# Patient Record
Sex: Male | Born: 1975 | Race: Black or African American | Hispanic: No | Marital: Single
Health system: Southern US, Community
[De-identification: ages and names within clinical notes are randomized; demographics above are authoritative.]

---

## 2005-03-04 ENCOUNTER — Ambulatory Visit: Payer: Self-pay | Admitting: Orthopedic Surgery

## 2005-04-09 ENCOUNTER — Ambulatory Visit (HOSPITAL_COMMUNITY): Admission: RE | Admit: 2005-04-09 | Discharge: 2005-04-09 | Payer: Self-pay

## 2008-03-04 ENCOUNTER — Emergency Department (HOSPITAL_COMMUNITY): Admission: EM | Admit: 2008-03-04 | Discharge: 2008-03-04 | Payer: Self-pay | Admitting: Emergency Medicine

## 2021-05-29 DIAGNOSIS — J392 Other diseases of pharynx: Secondary | ICD-10-CM | POA: Insufficient documentation

## 2021-06-03 DIAGNOSIS — M436 Torticollis: Secondary | ICD-10-CM | POA: Insufficient documentation

## 2021-06-03 DIAGNOSIS — W57XXXA Bitten or stung by nonvenomous insect and other nonvenomous arthropods, initial encounter: Secondary | ICD-10-CM | POA: Insufficient documentation

## 2021-06-04 ENCOUNTER — Other Ambulatory Visit: Payer: Self-pay

## 2021-06-04 ENCOUNTER — Ambulatory Visit (INDEPENDENT_AMBULATORY_CARE_PROVIDER_SITE_OTHER): Payer: BC Managed Care – PPO | Admitting: Internal Medicine

## 2021-06-04 DIAGNOSIS — M436 Torticollis: Secondary | ICD-10-CM | POA: Diagnosis not present

## 2021-06-04 DIAGNOSIS — R198 Other specified symptoms and signs involving the digestive system and abdomen: Secondary | ICD-10-CM

## 2021-06-04 DIAGNOSIS — W57XXXA Bitten or stung by nonvenomous insect and other nonvenomous arthropods, initial encounter: Secondary | ICD-10-CM | POA: Diagnosis not present

## 2021-06-04 DIAGNOSIS — R0989 Other specified symptoms and signs involving the circulatory and respiratory systems: Secondary | ICD-10-CM

## 2021-06-04 NOTE — Progress Notes (Addendum)
[facility]  Reason for Consult: Globus sensation following tick bite Referring Provider: Dr. Salli Real  Assessment: I think it is very unlikely that he has Lyme disease or other tickborne illness.  This is based on the fact that globus sensation is not a known side effect of Lyme disease and also the fact that the incidence of Lyme disease in [place] is very low.  He wanted to know if he could be treated empirically for Lyme.  I told him that it would be best to obtain serology then make any decision about further antibiotic therapy.  Plan: Lyme serology with reflex to Western blot I will call him with results next week  Addendum: I called and let Joseph Colon know that his total Lyme antibody was negative and that his recent illness is not due to Lyme disease or any other active infection.  Patient Active Problem List   Diagnosis Date Noted   Tick bite 06/03/2021    Priority: Medium   Globus sensation 06/04/2021   Neck stiffness 06/03/2021    Patient's Medications  New Prescriptions   No medications on file  Previous Medications   CHOLECALCIFEROL 25 MCG (1000 UT) CAPSULE    Take by mouth.  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  In May he noticed an attached tick on his left anterior thigh.  It was not engorged with blood.  He thinks it may have been there for 1 to 2 days.  He removed the tick.  The area of the bite was slightly raised and red for several days.  He treated it with topical alcohol.  Shortly afterward he began to notice an unusual sensation in his lower throat like something was "stuck there".  He did not have any sore throat.  He has not had any fever.  He has not had any generalized rash or target lesions.  Several weeks ago he did develop some pruritus on the medial aspect of his  right ankle.  He noted that when he scratched it it did get bright red.  That has since resolved.  He had a right shoulder surgery last December and is undergoing physical therapy.  He still has some soreness there but no other new joint pain or swelling.  He says that he did some reading and has been concerned that he might have Lyme disease.    He was seen at [facility] on 2 occasions.  The first time he recalls being given some type of injection and he was prescribed a trimethoprim/sulfamethoxazole.  He initially thought it was a little better but he continued to be bothered by the sensation in his throat.  He went back on 05/20/2021 and was given a Z-Pak and a prednisone Dosepak.  He had no change in his symptoms.  He was referred to ENT and saw someone (does not recall who) 2 days ago.  He was told that they could find no reason for his sensation.  Review of Systems: Review of Systems  Constitutional:  Negative for chills, diaphoresis, fever, malaise/fatigue and weight loss.  HENT:         As noted in HPI.  Respiratory:  Negative for cough, sputum production and shortness of breath.   Cardiovascular:  Negative for chest pain.  Gastrointestinal:  Negative for abdominal pain, diarrhea, heartburn, nausea and vomiting.  Musculoskeletal:  Positive for joint pain.  Skin:  Positive for itching. Negative for rash.     No past medical history on file.     No family history on file. Not on File  OBJECTIVE: Vitals:   06/04/21 1015  BP: 118/77  Pulse: 65  Temp: 98.2 F (36.8 C)  TempSrc: Oral  SpO2: 98%  Weight: 197 lb (89.4 kg)   There is no height or weight on file to calculate BMI.   Physical Exam Constitutional:      Comments: He is pleasant and in no distress.  HENT:     Mouth/Throat:     Mouth: Mucous membranes are moist.     Pharynx: Oropharynx is clear. No oropharyngeal exudate.  Cardiovascular:     Rate and Rhythm: Normal rate and regular rhythm.     Heart sounds:  No murmur heard. Pulmonary:     Effort: Pulmonary effort is normal.     Breath sounds: Normal breath sounds.  Abdominal:     Palpations: Abdomen is soft.     Tenderness: There is no abdominal tenderness.  Musculoskeletal:        General: No swelling or tenderness.     Comments: His right shoulder incisions have healed nicely without evidence of infection.  Lymphadenopathy:     Head:     Right side of head: No submental or submandibular adenopathy.     Left side of head: No submental or submandibular adenopathy.     Cervical: No cervical adenopathy.  Skin:    Findings: No rash.  Neurological:     General: No focal deficit present.  Psychiatric:        Mood and Affect: Mood normal.    Microbiology: No results found for this or any previous visit (from the past 240 hour(s)).  Cliffton Asters, MD [facility] [phone] pager   [phone] cell 06/04/2021, 10:48 AM

## 2021-06-05 LAB — B. BURGDORFI ANTIBODIES: B burgdorferi Ab IgG+IgM: <0.9 index

## 2021-07-09 ENCOUNTER — Other Ambulatory Visit: Payer: Self-pay | Admitting: Nurse Practitioner

## 2021-07-09 DIAGNOSIS — R221 Localized swelling, mass and lump, neck: Secondary | ICD-10-CM

## 2021-07-27 ENCOUNTER — Other Ambulatory Visit: Payer: Self-pay | Admitting: Nurse Practitioner

## 2021-07-27 ENCOUNTER — Ambulatory Visit
Admission: RE | Admit: 2021-07-27 | Discharge: 2021-07-27 | Disposition: A | Discharge status: Home / Self Care | Payer: BC Managed Care – PPO | Source: Ambulatory Visit | Attending: Nurse Practitioner | Admitting: Nurse Practitioner

## 2021-07-27 DIAGNOSIS — R221 Localized swelling, mass and lump, neck: Secondary | ICD-10-CM

## 2021-07-27 MED ORDER — IOPAMIDOL (ISOVUE-300) INJECTION 61%
75.0000 mL | Freq: Once | INTRAVENOUS | Status: AC | PRN
  Administered 2021-07-27: 75 mL via INTRAVENOUS

## 2021-10-22 ENCOUNTER — Encounter: Payer: Self-pay | Admitting: Podiatry

## 2021-10-22 ENCOUNTER — Ambulatory Visit (INDEPENDENT_AMBULATORY_CARE_PROVIDER_SITE_OTHER): Payer: BC Managed Care – PPO

## 2021-10-22 ENCOUNTER — Ambulatory Visit: Payer: BC Managed Care – PPO | Admitting: Podiatry

## 2021-10-22 ENCOUNTER — Other Ambulatory Visit: Payer: Self-pay

## 2021-10-22 DIAGNOSIS — M7752 Other enthesopathy of left foot: Secondary | ICD-10-CM

## 2021-10-22 DIAGNOSIS — M7750 Other enthesopathy of unspecified foot: Secondary | ICD-10-CM

## 2021-10-22 MED ORDER — MELOXICAM 15 MG PO TABS: 15.0000 mg | ORAL_TABLET | Freq: Every day | ORAL | 3 refills | Status: AC

## 2021-10-22 NOTE — Patient Instructions (Signed)

## 2021-10-26 ENCOUNTER — Encounter: Payer: Self-pay | Admitting: Podiatry

## 2021-10-26 NOTE — Progress Notes (Signed)
°  Subjective:  Patient ID: Joseph Colon, male    DOB: 13-Jun-1976,  MRN: [id number]  Chief Complaint  Patient presents with   Foot Pain    NP  L  foot pain  not diabetic    46 y.o. male presents with the above complaint. History confirmed with patient.  Pain is mostly in the arch.  Feels very tight in the morning.  He tried stretching  Objective:  Physical Exam: warm, good capillary refill, no trophic changes or ulcerative lesions, normal DP and PT pulses, and normal sensory exam. Left Foot: point tenderness of the mid plantar fascia  Radiographs: Multiple views x-ray of the left foot: no fracture, dislocation, swelling or degenerative changes noted no major plantar calcaneal spur Assessment:   1. Tendonitis of ankle or foot      Plan:  Patient was evaluated and treated and all questions answered.  Discussed the etiology and treatment options for plantar fasciitis including stretching, formal physical therapy, supportive shoegears such as a running shoe or sneaker, pre fabricated orthoses, injection therapy, and oral medications. We also discussed the role of surgical treatment of this for patients who do not improve after exhausting non-surgical treatment options.   -XR reviewed with patient -Educated patient on stretching and icing of the affected limb -Injection delivered to the plantar fascia of the left foot. -Rx for meloxicam. Educated on use, risks and benefits of the medication -Recommended plantar fascial brace we do not have his size in stock currently and I showed him the type and size to get on [place] he will get this.  After sterile prep with povidone-iodine solution and alcohol, the left heel was injected with 0.5cc 2% xylocaine plain, 0.5cc 0.5% marcaine plain, 5mg  triamcinolone acetonide, and 2mg  dexamethasone was injected along the medial plantar fascia at the insertion on the plantar calcaneus. The patient tolerated the procedure well without  complication.   Return in about 1 month (around 11/22/2021) for recheck plantar fasciitis.

## 2021-12-01 ENCOUNTER — Ambulatory Visit: Payer: BC Managed Care – PPO | Admitting: Podiatry

## 2021-12-01 ENCOUNTER — Other Ambulatory Visit: Payer: Self-pay

## 2021-12-01 DIAGNOSIS — M21862 Other specified acquired deformities of left lower leg: Secondary | ICD-10-CM

## 2021-12-01 DIAGNOSIS — M722 Plantar fascial fibromatosis: Secondary | ICD-10-CM | POA: Diagnosis not present

## 2021-12-03 ENCOUNTER — Encounter: Payer: Self-pay | Admitting: Podiatry

## 2021-12-03 NOTE — Progress Notes (Signed)
°  Subjective:  Patient ID: Joseph Colon, male    DOB: 1975-11-04,  MRN: [id number]  Chief Complaint  Patient presents with   Plantar Fasciitis    Follow up left foot    46 y.o. male presents with the above complaint. History confirmed with patient.  He notes a moderate amount of improvement.  The injection was helpful.  The plantar fascia brace has not helped as much.  Objective:  Physical Exam: warm, good capillary refill, no trophic changes or ulcerative lesions, normal DP and PT pulses, and normal sensory exam. Left Foot: Still some tenderness in the mid plantar fascia improved since last visit.  Radiographs: Multiple views x-ray of the left foot: no fracture, dislocation, swelling or degenerative changes noted no major plantar calcaneal spur Assessment:   1. Plantar fasciitis of left foot   2. Gastrocnemius equinus of left lower extremity      Plan:  Patient was evaluated and treated and all questions answered.  Discussed the etiology and treatment options for plantar fasciitis including stretching, formal physical therapy, supportive shoegears such as a running shoe or sneaker, pre fabricated orthoses, injection therapy, and oral medications. We also discussed the role of surgical treatment of this for patients who do not improve after exhausting non-surgical treatment options.   Overall state quite a bit of improvement the first injection.  I recommend he continue his home stretching and therapy exercise.  We also discussed the option of referral to a physical therapist for dry needling in the plantar fascia and calf which is very tight.  I have sent a referral to this for [facility] for him and I will see him back in a few months for reevaluation.   No follow-ups on file.

## 2022-03-01 ENCOUNTER — Ambulatory Visit: Payer: BC Managed Care – PPO | Admitting: Podiatry

## 2022-03-01 DIAGNOSIS — M722 Plantar fascial fibromatosis: Secondary | ICD-10-CM | POA: Diagnosis not present

## 2022-03-07 ENCOUNTER — Encounter: Payer: Self-pay | Admitting: Podiatry

## 2022-03-07 NOTE — Progress Notes (Signed)
?  Subjective:  ?Patient ID: Joseph Colon, male    DOB: 08/30/1976,  MRN: [id number] ? ?Chief Complaint  ?Patient presents with  ? Follow-up  ?  3 month for PF. Pain is minimal. Patient has not seen a physical therapist.   ? ? ?46 y.o. male presents with the above complaint. History confirmed with patient.  Doing much better nearly 100% at this point ? ?Objective:  ?Physical Exam: ?warm, good capillary refill, no trophic changes or ulcerative lesions, normal DP and PT pulses, and normal sensory exam. ?Left Foot: He has little to no tenderness in the plantar fascia ? ?Radiographs: ?Multiple views x-ray of the left foot: no fracture, dislocation, swelling or degenerative changes noted no major plantar calcaneal spur ?Assessment:  ? ?1. Plantar fasciitis of left foot   ? ? ? ?Plan:  ?Patient was evaluated and treated and all questions answered. ? ?Doing much better advised to continue therapy exercises.  I will see him back as needed if this returns ? ?Return if symptoms worsen or fail to improve.  ? ?

## 2022-06-09 ENCOUNTER — Other Ambulatory Visit: Payer: Self-pay | Admitting: Chiropractic Medicine

## 2022-06-09 ENCOUNTER — Ambulatory Visit
Admission: RE | Admit: 2022-06-09 | Discharge: 2022-06-09 | Disposition: A | Discharge status: Home / Self Care | Payer: BC Managed Care – PPO | Source: Ambulatory Visit | Attending: Chiropractic Medicine | Admitting: Chiropractic Medicine

## 2022-06-09 DIAGNOSIS — M545 Low back pain, unspecified: Secondary | ICD-10-CM

## 2023-05-06 IMAGING — CT CT NECK W/ CM
2 of 3 series · 7 of 14 positions shown, 8 images · IV contrast (iopamidol)
Comparison: None.

CLINICAL DATA: Right-sided neck swelling extending from under jaw
to shoulder. Difficulty swallowing

EXAM:
CT NECK WITH CONTRAST
TECHNIQUE: Multidetector CT imaging of the neck was performed using the
standard protocol following the bolus administration of intravenous
contrast.
CONTRAST:  75mL [id number]-WSS IOPAMIDOL (KH0G74-WSS) INJECTION 61%

[Series 3: neck · axial · 0.52mm/px · z∈[-298,-132]mm · 4 of 139 slices shown, 5 images]
[im 28/139  soft-tissue]
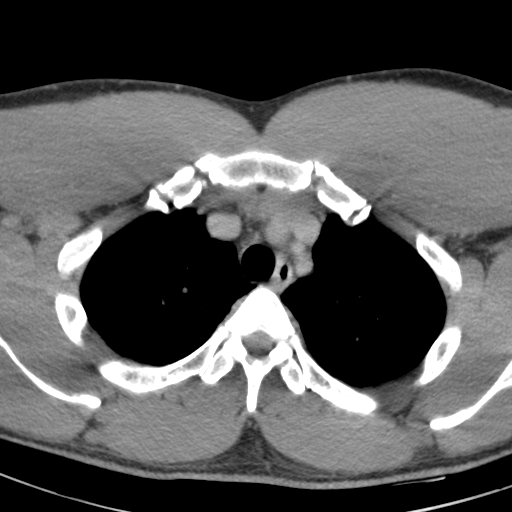
[im 28/139  bone]
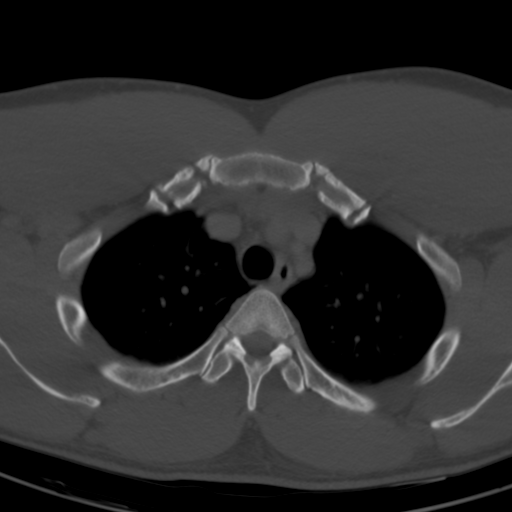
[im 56/139  bone]
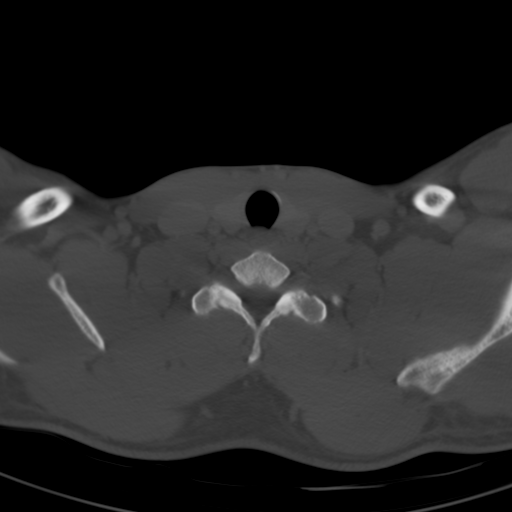
[im 83/139  bone]
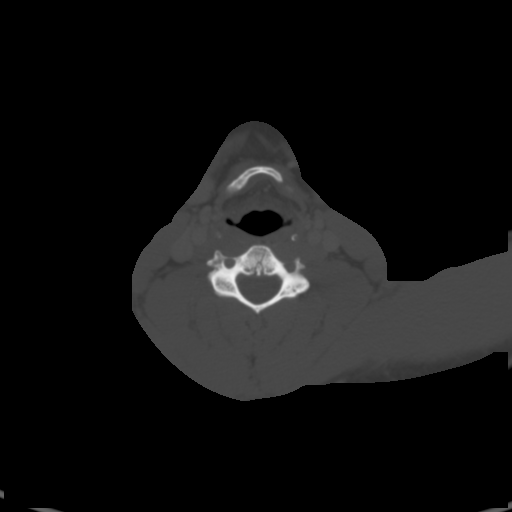
[im 111/139  bone]
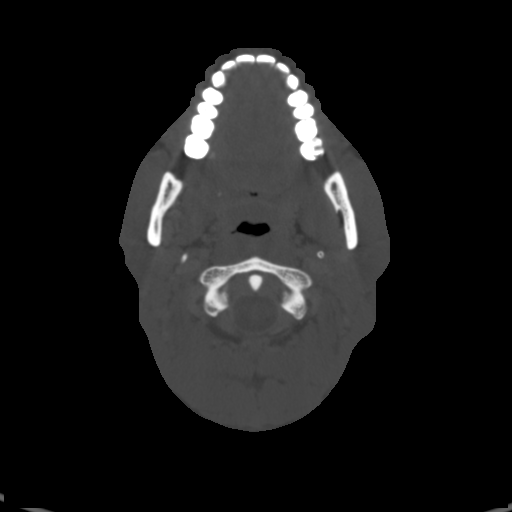

[Series 8: angled axial-oropharynx · axial · 0.47mm/px · z∈[-294,-158]mm · 3 of 138 slices shown]
[im 35/138  bone]
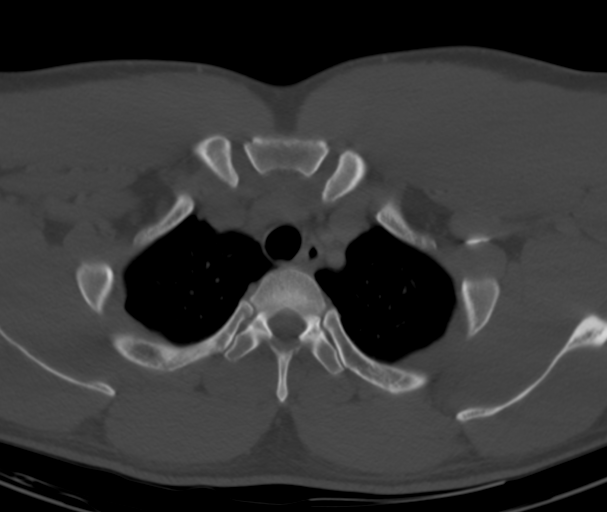
[im 69/138  bone]
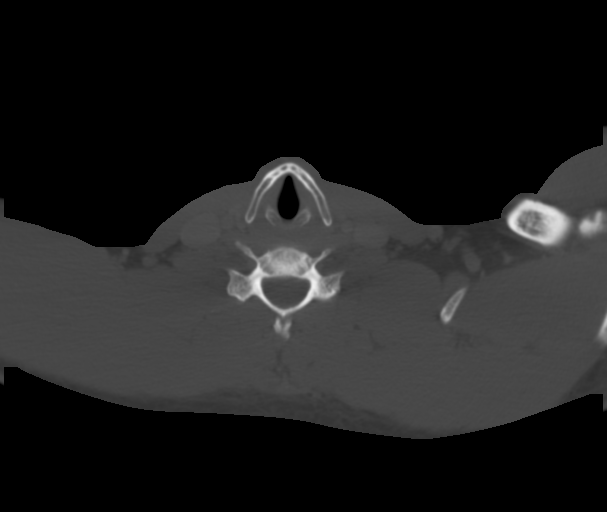
[im 103/138  bone]
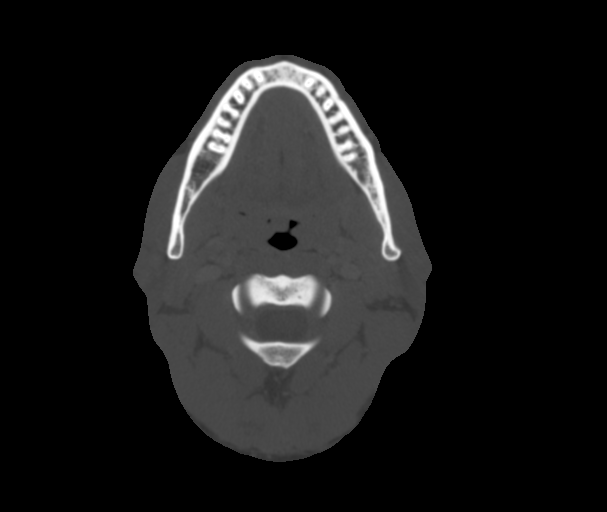

[7 of 14 positions shown; findings below may reference images not displayed]

FINDINGS: Pharynx and larynx: Normal. No mass or swelling.

Salivary glands: No inflammation, mass, or stone.

Thyroid: Normal.

Lymph nodes: None enlarged or abnormal density.

Vascular: Negative.

Limited intracranial: Negative.

Visualized orbits: Negative.

Mastoids and visualized paranasal sinuses: Clear.

Skeleton: No acute or aggressive process.

Upper chest: Negative.
IMPRESSION: Normal neck CT.  No explanation for symptoms.

## 2023-05-13 ENCOUNTER — Other Ambulatory Visit: Payer: Self-pay

## 2023-05-13 ENCOUNTER — Emergency Department (HOSPITAL_BASED_OUTPATIENT_CLINIC_OR_DEPARTMENT_OTHER): Payer: BC Managed Care – PPO

## 2023-05-13 ENCOUNTER — Emergency Department (HOSPITAL_BASED_OUTPATIENT_CLINIC_OR_DEPARTMENT_OTHER)
Admission: EM | Admit: 2023-05-13 | Discharge: 2023-05-13 | Disposition: A | Discharge status: Home / Self Care | Payer: BC Managed Care – PPO | Attending: Student | Admitting: Student

## 2023-05-13 DIAGNOSIS — R110 Nausea: Secondary | ICD-10-CM | POA: Diagnosis not present

## 2023-05-13 DIAGNOSIS — R140 Abdominal distension (gaseous): Secondary | ICD-10-CM | POA: Insufficient documentation

## 2023-05-13 DIAGNOSIS — R1084 Generalized abdominal pain: Secondary | ICD-10-CM | POA: Insufficient documentation

## 2023-05-13 LAB — CBC
HCT: 39.9 % (ref 39.0–52.0)
Hemoglobin: 13.4 g/dL (ref 13.0–17.0)
MCH: 29 pg (ref 26.0–34.0)
MCHC: 33.6 g/dL (ref 30.0–36.0)
MCV: 86.4 fL (ref 80.0–100.0)
Platelets: 187 10*3/uL (ref 150–400)
RBC: 4.62 MIL/uL (ref 4.22–5.81)
RDW: 12.8 % (ref 11.5–15.5)
WBC: 6.4 10*3/uL (ref 4.0–10.5)
nRBC: 0 % (ref 0.0–0.2)

## 2023-05-13 LAB — COMPREHENSIVE METABOLIC PANEL
ALT: 70 U/L — ABNORMAL HIGH (ref 0–44)
AST: 111 U/L — ABNORMAL HIGH (ref 15–41)
Albumin: 4.1 g/dL (ref 3.5–5.0)
Alkaline Phosphatase: 60 U/L (ref 38–126)
Anion gap: 9 (ref 5–15)
BUN: 23 mg/dL — ABNORMAL HIGH (ref 6–20)
CO2: 25 mmol/L (ref 22–32)
Calcium: 8.8 mg/dL — ABNORMAL LOW (ref 8.9–10.3)
Chloride: 104 mmol/L (ref 98–111)
Creatinine, Ser: 1.66 mg/dL — ABNORMAL HIGH (ref 0.61–1.24)
GFR, Estimated: 51 mL/min — ABNORMAL LOW (ref >60)
Glucose, Bld: 127 mg/dL — ABNORMAL HIGH (ref 70–99)
Potassium: 3.7 mmol/L (ref 3.5–5.1)
Sodium: 138 mmol/L (ref 135–145)
Total Bilirubin: 1 mg/dL (ref 0.3–1.2)
Total Protein: 6.8 g/dL (ref 6.5–8.1)

## 2023-05-13 LAB — LIPASE, BLOOD: Lipase: 14 U/L (ref 11–51)

## 2023-05-13 MED ORDER — MAGNESIUM CITRATE PO SOLN
1.0000 | Freq: Once | ORAL | Status: DC
  Filled 2023-05-13: qty 296

## 2023-05-13 MED ORDER — DICYCLOMINE HCL 10 MG PO CAPS
ORAL_CAPSULE | ORAL | Status: AC
  Filled 2023-05-13: qty 2

## 2023-05-13 MED ORDER — LACTATED RINGERS IV BOLUS
1000.0000 mL | Freq: Once | INTRAVENOUS | Status: AC
  Administered 2023-05-13: 1000 mL via INTRAVENOUS

## 2023-05-13 MED ORDER — IOHEXOL 300 MG/ML  SOLN
100.0000 mL | Freq: Once | INTRAMUSCULAR | Status: AC | PRN
  Administered 2023-05-13: 85 mL via INTRAVENOUS

## 2023-05-13 MED ORDER — DICYCLOMINE HCL 10 MG PO CAPS
20.0000 mg | ORAL_CAPSULE | Freq: Once | ORAL | Status: AC
  Administered 2023-05-13: 20 mg via ORAL

## 2023-05-13 NOTE — ED Provider Notes (Signed)
[place] EMERGENCY DEPARTMENT AT [facility] Provider Note  CSN: [id number] Arrival date & time: 05/13/23 0034  Chief Complaint(s) Abdominal Pain  HPI Joseph Colon is a 47 y.o. male who presents emergency room for evaluation of generalized abdominal pain and bloating.  States that pain began several hours prior to arrival.  Endorses nausea but denies vomiting.  Denies diarrhea.  Denies chest pain, shortness of breath, headache, fever or other systemic symptoms.   Past Medical History No past medical history on file. Patient Active Problem List   Diagnosis Date Noted   Globus sensation 06/04/2021   Neck stiffness 06/03/2021   Tick bite 06/03/2021   Throat irritation 05/29/2021   Home Medication(s) Prior to Admission medications   Medication Sig Start Date End Date Taking? Authorizing Provider  Cholecalciferol 25 MCG (1000 UT) capsule Take by mouth.    [provider]  meloxicam (MOBIC) 15 MG tablet Take 1 tablet (15 mg total) by mouth daily. 10/22/21   Edwin Cap, DPM                                                                                                                                    Past Surgical History No past surgical history on file. Family History No family history on file.  Social History   Allergies Patient has no known allergies.  Review of Systems Review of Systems  Gastrointestinal:  Positive for abdominal distention and abdominal pain.    Physical Exam Vital Signs  I have reviewed the triage vital signs BP 117/74   Pulse 72   Temp 98 F (36.7 C)   Resp 18   Ht 5\' 8"  (1.727 m)   Wt 82.6 kg   SpO2 99%   BMI 27.67 kg/m   Physical Exam Constitutional:      General: He is not in acute distress.    Appearance: Normal appearance.  HENT:     Head: Normocephalic and atraumatic.     Nose: No congestion or rhinorrhea.  Eyes:     General:        Right eye: No discharge.        Left eye: No discharge.      Extraocular Movements: Extraocular movements intact.     Pupils: Pupils are equal, round, and reactive to light.  Cardiovascular:     Rate and Rhythm: Normal rate and regular rhythm.     Heart sounds: No murmur heard. Pulmonary:     Effort: No respiratory distress.     Breath sounds: No wheezing or rales.  Abdominal:     General: There is no distension.     Tenderness: There is generalized abdominal tenderness.  Musculoskeletal:        General: Normal range of motion.     Cervical back: Normal range of motion.  Skin:    General: Skin is warm and dry.  Neurological:     General:  No focal deficit present.     Mental Status: He is alert.     ED Results and Treatments Labs (all labs ordered are listed, but only abnormal results are displayed) Labs Reviewed  COMPREHENSIVE METABOLIC PANEL - Abnormal; Notable for the following components:      Result Value   Glucose, Bld 127 (*)    BUN 23 (*)    Creatinine, Ser 1.66 (*)    Calcium 8.8 (*)    AST 111 (*)    ALT 70 (*)    GFR, Estimated 51 (*)    All other components within normal limits  LIPASE, BLOOD  CBC  URINALYSIS, ROUTINE W REFLEX MICROSCOPIC                                                                                                                          Radiology No results found.  Pertinent labs & imaging results that were available during my care of the patient were reviewed by me and considered in my medical decision making (see MDM for details).  Medications Ordered in ED Medications  dicyclomine (BENTYL) capsule 20 mg (20 mg Oral Given 05/13/23 0120)  lactated ringers bolus 1,000 mL (1,000 mLs Intravenous New Bag/Given 05/13/23 0214)  iohexol (OMNIPAQUE) 300 MG/ML solution 100 mL (85 mLs Intravenous Contrast Given 05/13/23 0239)                                                                                                                                     Procedures Procedures  (including critical  care time)  Medical Decision Making / ED Course   This patient presents to the ED for concern of abdominal pain and distention, this involves an extensive number of treatment options, and is a complaint that carries with it a high risk of complications and morbidity.  The differential diagnosis includes gastritis, constipation, obstruction, cholecystitis, biliary colic, diverticulitis, cystitis  MDM: Patient seen in the emergency department for evaluation of abdominal pain and distention.  Physical exam with generalized abdominal tenderness to palpation and abdominal distention.  Laboratory evaluation with a BUN of 23, creatinine 1.66, AST 111, ALT 70 [place] otherwise unremarkable.  No significant leukocytosis or lipase elevation.  CT abdomen pelvis was performed but given global EMR outages overnight, patient will not be able to have his CAT scan read overnight by emergency radiologist.  I did briefly look at  the scans and did not see significant pericholecystic fluid or evidence of life-threatening infection but did see a large stool burden.  I discussed my radiology limitations with the patient and we have shared decision making to formulate a plan.  Our plan will be that the patient at this time will go home and perform a bowel cleanout at home with mag citrate.  As soon as I have the results of his scan I will contact the patient to monitor his symptoms.  He is hemodynamically stable here in the emergency department symptoms are starting to improve and he does not want to wait in the emergency department until his scans can be read which is not unreasonable.  Thus, patient discharged with outpatient follow-up and I will personally contact the patient when I have his results to further discuss his care.   Additional history obtained:  -External records from outside source obtained and reviewed including: Chart review including previous notes, labs, imaging, consultation notes   Lab Tests: -I  ordered, reviewed, and interpreted labs.   The pertinent results include:   Labs Reviewed  COMPREHENSIVE METABOLIC PANEL - Abnormal; Notable for the following components:      Result Value   Glucose, Bld 127 (*)    BUN 23 (*)    Creatinine, Ser 1.66 (*)    Calcium 8.8 (*)    AST 111 (*)    ALT 70 (*)    GFR, Estimated 51 (*)    All other components within normal limits  LIPASE, BLOOD  CBC  URINALYSIS, ROUTINE W REFLEX MICROSCOPIC      Imaging Studies ordered: I ordered imaging studies including CTAP I independently visualized and interpreted imaging.  Official radiologist read is pending  Medicines ordered and prescription drug management: Meds ordered this encounter  Medications   dicyclomine (BENTYL) capsule 20 mg   lactated ringers bolus 1,000 mL   iohexol (OMNIPAQUE) 300 MG/ML solution 100 mL    -I have reviewed the patients home medicines and have made adjustments as needed  Critical interventions none   Cardiac Monitoring: The patient was maintained on a cardiac monitor.  I personally viewed and interpreted the cardiac monitored which showed an underlying rhythm of: NSR  Social Determinants of Health:  Factors impacting patients care include: none   Reevaluation: After the interventions noted above, I reevaluated the patient and found that they have :improved  Co morbidities that complicate the patient evaluation No past medical history on file.    Dispostion: I considered admission for this patient, and given EMR limitations, we shared decision making and patient will be discharged today and I will personally call to reevaluate the patient when I have the results of his CAT scan.  He was given strict return precautions of which he voiced understanding he was discharged     Final Clinical Impression(s) / ED Diagnoses Final diagnoses:  None     @PCDICTATION @    Dilraj Killgore, Wyn Forster, MD 05/13/23 [phone]

## 2023-05-13 NOTE — Discharge Instructions (Signed)
You were seen in the emergency room for evaluation of generalized abdominal pain.  Your workup today does show some mild elevation in your liver enzymes but this is nonspecific.  A CAT scan was done but due to the [employer], we are unable to get the scan read by radiologist tonight.  We did speak about this and we have come up with a plan where he will perform a bowel cleanout tomorrow morning and I will call you with the results of your CAT scan.  If they are concerned and I will ask you to come back to the emergency department for further evaluation.  Return to the emergency department if your symptoms are worsening and we will reevaluate immediately.

## 2023-05-13 NOTE — ED Notes (Signed)
Patient to CT.

## 2023-05-13 NOTE — ED Triage Notes (Signed)
Ambulatory to room 11. A+Ox4. Generalized abd pain. Starting several hours ago. Last BM earlier today. -N/-V/-D, denies CP, denies SOB.
